# Patient Record
Sex: Female | Born: 1960
Health system: Southern US, Community
[De-identification: ages and names within clinical notes are randomized; demographics above are authoritative.]

## PROBLEM LIST (undated history)

## (undated) DIAGNOSIS — E7800 Pure hypercholesterolemia, unspecified: Secondary | ICD-10-CM

## (undated) DIAGNOSIS — Z8249 Family history of ischemic heart disease and other diseases of the circulatory system: Secondary | ICD-10-CM

## (undated) DIAGNOSIS — R7301 Impaired fasting glucose: Secondary | ICD-10-CM

## (undated) DIAGNOSIS — E785 Hyperlipidemia, unspecified: Secondary | ICD-10-CM

## (undated) DIAGNOSIS — R030 Elevated blood-pressure reading, without diagnosis of hypertension: Secondary | ICD-10-CM

## (undated) DIAGNOSIS — F32A Depression, unspecified: Secondary | ICD-10-CM

## (undated) DIAGNOSIS — M810 Age-related osteoporosis without current pathological fracture: Secondary | ICD-10-CM

## (undated) DIAGNOSIS — H269 Unspecified cataract: Secondary | ICD-10-CM

## (undated) HISTORY — PX: CATARACT EXTRACTION: SUR2

## (undated) HISTORY — DX: Unspecified cataract: H26.9

## (undated) HISTORY — DX: Elevated blood-pressure reading, without diagnosis of hypertension: R03.0

## (undated) HISTORY — DX: Age-related osteoporosis without current pathological fracture: M81.0

## (undated) HISTORY — DX: Depression, unspecified: F32.A

## (undated) HISTORY — DX: Impaired fasting glucose: R73.01

## (undated) HISTORY — DX: Pure hypercholesterolemia, unspecified: E78.00

## (undated) HISTORY — DX: Hyperlipidemia, unspecified: E78.5

## (undated) HISTORY — DX: Family history of ischemic heart disease and other diseases of the circulatory system: Z82.49

---

## 1992-07-27 HISTORY — PX: TUBAL LIGATION: SHX77

## 1997-12-21 ENCOUNTER — Other Ambulatory Visit: Admission: RE | Admit: 1997-12-21 | Discharge: 1997-12-21 | Payer: Self-pay | Admitting: Obstetrics & Gynecology

## 1999-02-14 ENCOUNTER — Other Ambulatory Visit: Admission: RE | Admit: 1999-02-14 | Discharge: 1999-02-14 | Payer: Self-pay | Admitting: Obstetrics & Gynecology

## 2000-10-07 ENCOUNTER — Other Ambulatory Visit: Admission: RE | Admit: 2000-10-07 | Discharge: 2000-10-07 | Payer: Self-pay | Admitting: Obstetrics & Gynecology

## 2004-07-23 ENCOUNTER — Other Ambulatory Visit: Admission: RE | Admit: 2004-07-23 | Discharge: 2004-07-23 | Payer: Self-pay | Admitting: Obstetrics & Gynecology

## 2010-07-27 HISTORY — PX: CATARACT EXTRACTION: SUR2

## 2014-05-04 ENCOUNTER — Ambulatory Visit (INDEPENDENT_AMBULATORY_CARE_PROVIDER_SITE_OTHER): Payer: BC Managed Care – PPO | Admitting: Gynecology

## 2014-05-04 ENCOUNTER — Encounter: Payer: Self-pay | Admitting: Gynecology

## 2014-05-04 VITALS — BP 104/60 | HR 70 | Resp 18 | Ht 61.0 in | Wt 120.0 lb

## 2014-05-04 DIAGNOSIS — Z01419 Encounter for gynecological examination (general) (routine) without abnormal findings: Secondary | ICD-10-CM

## 2014-05-04 DIAGNOSIS — Z124 Encounter for screening for malignant neoplasm of cervix: Secondary | ICD-10-CM

## 2014-05-04 NOTE — Progress Notes (Signed)
53 y.o. Married Caucasian female   G2P2002 here for annual exam. Pt reports menses are regular.  She does not report hot flashes, does not have night sweats, does not have vaginal dryness.  She is not using lubricants.  She still having cycles, mostly regular, 3d of flow.    Patient's last menstrual period was 04/10/2014.          Sexually active: Yes.    The current method of family planning is tubal ligation.    Exercising: No.  The patient does not participate in regular exercise at present. Last pap: 2-3 years ago - WNL  Abnormal PAP: yes, 9 years ago no colpo bx done Mammogram: 03/2014 Normal  BSE: yes  Colonoscopy: 07/2010- Normal f/u in 5 years DEXA: 10 years ago  Alcohol: 1-2 drinks/wk Tobacco: no   Labs: Marton Redwood, MD  There are no preventive care reminders to display for this patient.  Family History  Problem Relation Age of Onset  . Cancer Father   . Esophageal cancer Brother   . Heart attack Father     45's  . Thyroid disease Mother     There are no active problems to display for this patient.   History reviewed. No pertinent past medical history.  Past Surgical History  Procedure Laterality Date  . Tubal ligation  1994  . Cataract extraction Left   . Cesarean section  x1    Allergies: Review of patient's allergies indicates no known allergies.  Current Outpatient Prescriptions  Medication Sig Dispense Refill  . Calcium 1200-1000 MG-UNIT CHEW Chew by mouth.      . Cholecalciferol (VITAMIN D) 2000 UNITS CAPS Take by mouth.       No current facility-administered medications for this visit.    ROS: Pertinent items are noted in HPI.  Exam:    BP 104/60  Pulse 70  Resp 18  Ht 5\' 1"  (1.549 m)  Wt 120 lb (54.432 kg)  BMI 22.69 kg/m2  LMP 04/10/2014 Weight change: @WEIGHTCHANGE @ Last 3 height recordings:  Ht Readings from Last 3 Encounters:  05/04/14 5\' 1"  (1.549 m)   General appearance: alert, cooperative and appears stated age Head:  Normocephalic, without obvious abnormality, atraumatic Neck: no adenopathy, no carotid bruit, no JVD, supple, symmetrical, trachea midline and thyroid not enlarged, symmetric, no tenderness/mass/nodules Lungs: clear to auscultation bilaterally Breasts: normal appearance, no masses or tenderness Heart: regular rate and rhythm, S1, S2 normal, no murmur, click, rub or gallop Abdomen: soft, non-tender; bowel sounds normal; no masses,  no organomegaly Extremities: extremities normal, atraumatic, no cyanosis or edema Skin: Skin color, texture, turgor normal. No rashes or lesions Lymph nodes: Cervical, supraclavicular, and axillary nodes normal. no inguinal nodes palpated Neurologic: Grossly normal   Pelvic: External genitalia:  no lesions              Urethra: normal appearing urethra with no masses, tenderness or lesions              Bartholins and Skenes: Bartholin's, Urethra, Skene's normal                 Vagina: normal appearing vagina with normal color and discharge, no lesions              Cervix: normal appearance              Pap taken: Yes.          Bimanual Exam:  Uterus:  uterus is normal size, shape, consistency and  nontender                                      Adnexa:    normal adnexa in size, nontender and no masses                                      Rectovaginal: Confirms                                      Anus:  normal sphincter tone, no lesions  1. Encounter for routine gynecological examination  mammogram pap smear counseled on breast self exam, mammography screening, menopause, adequate intake of calcium and vitamin D, diet and exercise return annually or prn Discussed PAP guideline changes, importance of weight bearing exercises, calcium, vit D and balanced diet.  2. Screening for cervical cancer  - Pap Test with HP (IPS)     An After Visit Summary was printed and given to the patient.

## 2014-05-08 LAB — IPS PAP TEST WITH HPV

## 2014-05-28 ENCOUNTER — Encounter: Payer: Self-pay | Admitting: Gynecology

## 2015-10-29 ENCOUNTER — Encounter: Payer: Self-pay | Admitting: Internal Medicine

## 2015-11-12 ENCOUNTER — Encounter: Payer: Self-pay | Admitting: Internal Medicine

## 2016-03-22 DIAGNOSIS — Z23 Encounter for immunization: Secondary | ICD-10-CM | POA: Diagnosis not present

## 2016-04-10 DIAGNOSIS — L0889 Other specified local infections of the skin and subcutaneous tissue: Secondary | ICD-10-CM | POA: Diagnosis not present

## 2016-04-10 DIAGNOSIS — Z6823 Body mass index (BMI) 23.0-23.9, adult: Secondary | ICD-10-CM | POA: Diagnosis not present

## 2016-04-16 DIAGNOSIS — R21 Rash and other nonspecific skin eruption: Secondary | ICD-10-CM | POA: Diagnosis not present

## 2016-07-24 DIAGNOSIS — R7301 Impaired fasting glucose: Secondary | ICD-10-CM | POA: Diagnosis not present

## 2016-07-24 DIAGNOSIS — Z0000 Encounter for general adult medical examination without abnormal findings: Secondary | ICD-10-CM | POA: Diagnosis not present

## 2016-07-31 DIAGNOSIS — R7301 Impaired fasting glucose: Secondary | ICD-10-CM | POA: Diagnosis not present

## 2016-07-31 DIAGNOSIS — Z1389 Encounter for screening for other disorder: Secondary | ICD-10-CM | POA: Diagnosis not present

## 2016-07-31 DIAGNOSIS — F3289 Other specified depressive episodes: Secondary | ICD-10-CM | POA: Diagnosis not present

## 2016-07-31 DIAGNOSIS — N951 Menopausal and female climacteric states: Secondary | ICD-10-CM | POA: Diagnosis not present

## 2016-07-31 DIAGNOSIS — E784 Other hyperlipidemia: Secondary | ICD-10-CM | POA: Diagnosis not present

## 2016-07-31 DIAGNOSIS — Z0000 Encounter for general adult medical examination without abnormal findings: Secondary | ICD-10-CM | POA: Diagnosis not present

## 2016-08-03 ENCOUNTER — Ambulatory Visit (INDEPENDENT_AMBULATORY_CARE_PROVIDER_SITE_OTHER): Payer: BLUE CROSS/BLUE SHIELD | Admitting: Obstetrics & Gynecology

## 2016-08-03 ENCOUNTER — Encounter: Payer: Self-pay | Admitting: Obstetrics & Gynecology

## 2016-08-03 VITALS — BP 116/74 | HR 90 | Resp 16 | Ht 60.75 in | Wt 125.0 lb

## 2016-08-03 DIAGNOSIS — E7800 Pure hypercholesterolemia, unspecified: Secondary | ICD-10-CM | POA: Insufficient documentation

## 2016-08-03 DIAGNOSIS — N912 Amenorrhea, unspecified: Secondary | ICD-10-CM | POA: Diagnosis not present

## 2016-08-03 DIAGNOSIS — Z124 Encounter for screening for malignant neoplasm of cervix: Secondary | ICD-10-CM | POA: Diagnosis not present

## 2016-08-03 DIAGNOSIS — Z01419 Encounter for gynecological examination (general) (routine) without abnormal findings: Secondary | ICD-10-CM

## 2016-08-03 NOTE — Progress Notes (Signed)
56 y.o. G53P2002 Married Caucasian F here for annual exam.  Former patient of Dr. Brion Aliment.    Pt did have a cycle in August.  She'd had some bleeding about six months prior.  Bleeding significant diminished the last year.  She is now having hot flashes.  These are not too bothersome.  Does not want to be on any medication.   PCP:  Dr. Brigitte Pulse.  Last appt was last week.  Blood work was all normal except for mildly elevated cholesterol.  Total was 235 but her HDLs were good.  Patient's last menstrual period was 03/03/2016.          Sexually active: Yes.    The current method of family planning is tubal ligation.    Exercising: No.  The patient does not participate in regular exercise at present. Smoker:  Former smoker  Health Maintenance: Pap:  05/04/14 negative, HR HPV negative  History of abnormal Pap:  yes MMG:  04/16/16 at [facility]  Colonoscopy:  2012.  Pt was told she was not "cleaned out".  Did it at [facility].  Has appt at [facility].    BMD:   2005  TDaP:  PCP Pneumonia vaccine(s):  never Zostavax:   2016 at [place] C testing: donated blood about two years ago Screening Labs: PCP, Hb today: PCP, Urine today: PCP   reports that she has quit smoking. She has never used smokeless tobacco. She reports that she drinks about 0.5 - 1.0 oz of alcohol per week . She reports that she does not use drugs.  History reviewed. No pertinent past medical history.  Past Surgical History:  Procedure Laterality Date  . CATARACT EXTRACTION Left   . CESAREAN SECTION  x1  . TUBAL LIGATION  1994    Current Outpatient Prescriptions  Medication Sig Dispense Refill  . Cholecalciferol (VITAMIN D) 2000 UNITS CAPS Take by mouth.     No current facility-administered medications for this visit.     Family History  Problem Relation Age of Onset  . Cancer Father   . Esophageal cancer Brother   . Heart attack Father     9's  . Thyroid disease Mother     ROS:  Pertinent items are noted in  HPI.  Otherwise, a comprehensive ROS was negative.  Exam:   BP 116/74 (BP Location: Right Arm, Patient Position: Sitting, Cuff Size: Normal)   Pulse 90   Resp 16   Ht 5' 0.75" (1.543 m)   Wt 125 lb (56.7 kg)   LMP 03/03/2016   BMI 23.81 kg/m    Height: 5' 0.75" (154.3 cm)  Ht Readings from Last 3 Encounters:  08/03/16 5' 0.75" (1.543 m)  05/04/14 5\' 1"  (1.549 m)   General appearance: alert, cooperative and appears stated age Head: Normocephalic, without obvious abnormality, atraumatic Neck: no adenopathy, supple, symmetrical, trachea midline and thyroid normal to inspection and palpation Lungs: clear to auscultation bilaterally Breasts: normal appearance, no masses or tenderness Heart: regular rate and rhythm Abdomen: soft, non-tender; bowel sounds normal; no masses,  no organomegaly Extremities: extremities normal, atraumatic, no cyanosis or edema Skin: Skin color, texture, turgor normal. No rashes or lesions Lymph nodes: Cervical, supraclavicular, and axillary nodes normal. No abnormal inguinal nodes palpated Neurologic: Grossly normal   Pelvic: External genitalia:  no lesions              Urethra:  normal appearing urethra with no masses, tenderness or lesions  Bartholins and Skenes: normal                 Vagina: normal appearing vagina with normal color and discharge, no lesions              Cervix: no lesions              Pap taken: Yes.   Bimanual Exam:  Uterus:  normal size, contour, position, consistency, mobility, non-tender              Adnexa: normal adnexa and no mass, fullness, tenderness               Rectovaginal: Confirms               Anus:  normal sphincter tone, no lesions  Chaperone was present for exam.  A:  Well Woman with normal exam PMP, likely H/O mildly elevated LDLs  P:   Mammogram guidelines reviewed.  Release will be signed so I can get copy of MMG. Colonoscopy referral has been done by Dr. Brigitte Pulse pap smear obtained today [facility]  pending return annually or prn

## 2016-08-04 LAB — FOLLICLE STIMULATING HORMONE: FSH: 100.1 m[IU]/mL

## 2016-08-04 LAB — IPS PAP TEST WITH REFLEX TO HPV

## 2016-08-06 DIAGNOSIS — Z1212 Encounter for screening for malignant neoplasm of rectum: Secondary | ICD-10-CM | POA: Diagnosis not present

## 2016-09-10 ENCOUNTER — Telehealth: Payer: Self-pay

## 2016-09-10 NOTE — Telephone Encounter (Signed)
Rec'd from [facility] forward 23 pages to GI Historical Provider

## 2016-09-11 ENCOUNTER — Telehealth: Payer: Self-pay | Admitting: Gastroenterology

## 2016-09-11 NOTE — Telephone Encounter (Signed)
Dr.Jacobs reviewed records and accepted patient to schedule Direct Colonoscopy. Patient has been notified of this.

## 2016-09-25 ENCOUNTER — Encounter: Payer: Self-pay | Admitting: Gastroenterology

## 2016-11-13 ENCOUNTER — Ambulatory Visit (AMBULATORY_SURGERY_CENTER): Payer: BLUE CROSS/BLUE SHIELD | Admitting: *Deleted

## 2016-11-13 ENCOUNTER — Encounter: Payer: Self-pay | Admitting: Gastroenterology

## 2016-11-13 VITALS — Ht 61.0 in | Wt 123.3 lb

## 2016-11-13 DIAGNOSIS — Z1211 Encounter for screening for malignant neoplasm of colon: Secondary | ICD-10-CM

## 2016-11-13 MED ORDER — NA SULFATE-K SULFATE-MG SULF 17.5-3.13-1.6 GM/177ML PO SOLN: ORAL | 0 refills | Status: DC

## 2016-11-13 NOTE — Progress Notes (Signed)
Pt denies allergies to eggs or soy products. Denies difficulty with sedation or anesthesia. Denies any diet or weight loss medications. Denies use of supplemental oxygen.  Emmi instructions given for procedure.  

## 2016-11-27 ENCOUNTER — Ambulatory Visit (AMBULATORY_SURGERY_CENTER): Payer: BLUE CROSS/BLUE SHIELD | Admitting: Gastroenterology

## 2016-11-27 ENCOUNTER — Encounter: Payer: Self-pay | Admitting: Gastroenterology

## 2016-11-27 VITALS — BP 130/81 | HR 76 | Temp 97.3°F | Resp 13 | Ht 61.0 in | Wt 123.0 lb

## 2016-11-27 DIAGNOSIS — D125 Benign neoplasm of sigmoid colon: Secondary | ICD-10-CM | POA: Diagnosis not present

## 2016-11-27 DIAGNOSIS — Z1211 Encounter for screening for malignant neoplasm of colon: Secondary | ICD-10-CM | POA: Diagnosis not present

## 2016-11-27 DIAGNOSIS — K5730 Diverticulosis of large intestine without perforation or abscess without bleeding: Secondary | ICD-10-CM | POA: Diagnosis not present

## 2016-11-27 DIAGNOSIS — Z1212 Encounter for screening for malignant neoplasm of rectum: Secondary | ICD-10-CM

## 2016-11-27 MED ORDER — SODIUM CHLORIDE 0.9 % IV SOLN: 500.0000 mL | INTRAVENOUS | Status: DC

## 2016-11-27 NOTE — Progress Notes (Signed)
Called to room to assist during endoscopic procedure.  Patient ID and intended procedure confirmed with present staff. Received instructions for my participation in the procedure from the performing physician.  

## 2016-11-27 NOTE — Op Note (Signed)
[place] Patient Name: Susan Vega Procedure Date: 11/27/2016 9:48 AM MRN: [id number] Endoscopist: Milus Banister , MD Age: 56 Referring MD:  Date of Birth: [birth year] Gender: Female Account #: [id number] Procedure:                Colonoscopy Indications:              Screening for colorectal malignant neoplasm;                            colonoscopy Dr. Virgel Bouquet 2012 with "inadequate                            prep" and he recommended repeat colonoscopy at 5                            year interval Medicines:                Monitored Anesthesia Care Procedure:                Pre-Anesthesia Assessment:                           - Prior to the procedure, a History and Physical                            was performed, and patient medications and                            allergies were reviewed. The patient's tolerance of                            previous anesthesia was also reviewed. The risks                            and benefits of the procedure and the sedation                            options and risks were discussed with the patient.                            All questions were answered, and informed consent                            was obtained. Prior Anticoagulants: The patient has                            taken no previous anticoagulant or antiplatelet                            agents. ASA Grade Assessment: II - A patient with                            mild systemic disease. After reviewing the risks  and benefits, the patient was deemed in                            satisfactory condition to undergo the procedure.                           After obtaining informed consent, the colonoscope                            was passed under direct vision. Throughout the                            procedure, the patient's blood pressure, pulse, and                            oxygen saturations were monitored continuously. The                             Model PCF-H190DL [id number]) scope was introduced                            through the anus and advanced to the the cecum,                            identified by appendiceal orifice and ileocecal                            valve. The colonoscopy was performed without                            difficulty. The patient tolerated the procedure                            well. The quality of the bowel preparation was                            excellent. The ileocecal valve, appendiceal                            orifice, and rectum were photographed. Scope In: 10:04:06 AM Scope Out: 10:15:56 AM Scope Withdrawal Time: 0 hours 9 minutes 7 seconds  Total Procedure Duration: 0 hours 11 minutes 50 seconds  Findings:                 A 3 mm polyp was found in the sigmoid colon. The                            polyp was sessile. The polyp was removed with a                            cold snare. Resection and retrieval were complete.                           Multiple small-mouthed diverticula were found in  the left colon.                           The exam was otherwise without abnormality on                            direct and retroflexion views. Complications:            No immediate complications. Estimated blood loss:                            None. Estimated Blood Loss:     Estimated blood loss: none. Impression:               - One 3 mm polyp in the sigmoid colon, removed with                            a cold snare. Resected and retrieved.                           - Diverticulosis in the left colon.                           - The examination was otherwise normal on direct                            and retroflexion views. Recommendation:           - Patient has a contact number available for                            emergencies. The signs and symptoms of potential                            delayed complications were  discussed with the                            patient. Return to normal activities tomorrow.                            Written discharge instructions were provided to the                            patient.                           - Resume previous diet.                           - Continue present medications.                           You will receive a letter within 2-3 weeks with the                            pathology results and my final recommendations.  If the polyp(s) is proven to be 'pre-cancerous' on                            pathology, you will need repeat colonoscopy in 5                            years. If the polyp(s) is NOT 'precancerous' on                            pathology then you should repeat colon cancer                            screening in 10 years with colonoscopy without need                            for colon cancer screening by any method prior to                            then (including stool testing). Milus Banister, MD 11/27/2016 10:20:49 AM This report has been signed electronically.

## 2016-11-27 NOTE — Patient Instructions (Signed)
Handouts given on polyps and diverticulosis  YOU HAD AN ENDOSCOPIC PROCEDURE TODAY: Refer to the procedure report and other information in the discharge instructions given to you for any specific questions about what was found during the examination. If this information does not answer your questions, please call [place] office at [phone] to clarify.   YOU SHOULD EXPECT: Some feelings of bloating in the abdomen. Passage of more gas than usual. Walking can help get rid of the air that was put into your GI tract during the procedure and reduce the bloating. If you had a lower endoscopy (such as a colonoscopy or flexible sigmoidoscopy) you may notice spotting of blood in your stool or on the toilet paper. Some abdominal soreness may be present for a day or two, also.  DIET: Your first meal following the procedure should be a light meal and then it is ok to progress to your normal diet. A half-sandwich or bowl of soup is an example of a good first meal. Heavy or fried foods are harder to digest and may make you feel nauseous or bloated. Drink plenty of fluids but you should avoid alcoholic beverages for 24 hours. If you had a esophageal dilation, please see attached instructions for diet.    ACTIVITY: Your care partner should take you home directly after the procedure. You should plan to take it easy, moving slowly for the rest of the day. You can resume normal activity the day after the procedure however YOU SHOULD NOT DRIVE, use power tools, machinery or perform tasks that involve climbing or major physical exertion for 24 hours (because of the sedation medicines used during the test).   SYMPTOMS TO REPORT IMMEDIATELY: A gastroenterologist can be reached at any hour. Please call [phone]  for any of the following symptoms:  Following lower endoscopy (colonoscopy, flexible sigmoidoscopy) Excessive amounts of blood in the stool  Significant tenderness, worsening of abdominal pains  Swelling of  the abdomen that is new, acute  Fever of 100 or higher    FOLLOW UP:  If any biopsies were taken you will be contacted by phone or by letter within the next 1-3 weeks. Call [phone]  if you have not heard about the biopsies in 3 weeks.  Please also call with any specific questions about appointments or follow up tests.  

## 2016-11-27 NOTE — Progress Notes (Signed)
Pt's states no medical or surgical changes since previsit or office visit. 

## 2016-11-27 NOTE — Progress Notes (Signed)
Report to PACU, RN, vss, BBS= Clear.  

## 2016-11-30 ENCOUNTER — Telehealth: Payer: Self-pay

## 2016-11-30 NOTE — Telephone Encounter (Signed)
  Follow up Call-  Call back number 11/27/2016  Post procedure Call Back phone  # [phone]  Permission to leave phone message Yes  Some recent data might be hidden     Patient questions:  Do you have a fever, pain , or abdominal swelling? No. Pain Score  0 *  Have you tolerated food without any problems? Yes.    Have you been able to return to your normal activities? Yes.    Do you have any questions about your discharge instructions: Diet   No. Medications  No. Follow up visit  No.  Do you have questions or concerns about your Care? No.  Actions: * If pain score is 4 or above: No action needed, pain <4.

## 2016-12-01 ENCOUNTER — Encounter: Payer: Self-pay | Admitting: Gastroenterology

## 2017-04-23 DIAGNOSIS — Z1231 Encounter for screening mammogram for malignant neoplasm of breast: Secondary | ICD-10-CM | POA: Diagnosis not present

## 2017-04-27 DIAGNOSIS — R922 Inconclusive mammogram: Secondary | ICD-10-CM | POA: Diagnosis not present

## 2017-06-10 DIAGNOSIS — M546 Pain in thoracic spine: Secondary | ICD-10-CM | POA: Diagnosis not present

## 2017-06-10 DIAGNOSIS — M25511 Pain in right shoulder: Secondary | ICD-10-CM | POA: Diagnosis not present

## 2017-06-10 DIAGNOSIS — Z6823 Body mass index (BMI) 23.0-23.9, adult: Secondary | ICD-10-CM | POA: Diagnosis not present

## 2017-07-30 DIAGNOSIS — E7849 Other hyperlipidemia: Secondary | ICD-10-CM | POA: Diagnosis not present

## 2017-07-30 DIAGNOSIS — R7301 Impaired fasting glucose: Secondary | ICD-10-CM | POA: Diagnosis not present

## 2017-08-06 DIAGNOSIS — R7301 Impaired fasting glucose: Secondary | ICD-10-CM | POA: Diagnosis not present

## 2017-08-06 DIAGNOSIS — E7849 Other hyperlipidemia: Secondary | ICD-10-CM | POA: Diagnosis not present

## 2017-08-06 DIAGNOSIS — Z1389 Encounter for screening for other disorder: Secondary | ICD-10-CM | POA: Diagnosis not present

## 2017-08-06 DIAGNOSIS — Z0000 Encounter for general adult medical examination without abnormal findings: Secondary | ICD-10-CM | POA: Diagnosis not present

## 2017-08-06 DIAGNOSIS — Z23 Encounter for immunization: Secondary | ICD-10-CM | POA: Diagnosis not present

## 2017-08-06 DIAGNOSIS — F3289 Other specified depressive episodes: Secondary | ICD-10-CM | POA: Diagnosis not present

## 2017-10-19 DIAGNOSIS — J029 Acute pharyngitis, unspecified: Secondary | ICD-10-CM | POA: Diagnosis not present

## 2017-10-19 DIAGNOSIS — R05 Cough: Secondary | ICD-10-CM | POA: Diagnosis not present

## 2017-10-29 ENCOUNTER — Ambulatory Visit: Payer: BLUE CROSS/BLUE SHIELD | Admitting: Obstetrics & Gynecology

## 2018-01-14 ENCOUNTER — Ambulatory Visit: Payer: BLUE CROSS/BLUE SHIELD | Admitting: Obstetrics & Gynecology

## 2018-01-14 ENCOUNTER — Other Ambulatory Visit (HOSPITAL_COMMUNITY)
Admission: RE | Admit: 2018-01-14 | Discharge: 2018-01-14 | Disposition: A | Discharge status: Home / Self Care | Payer: BLUE CROSS/BLUE SHIELD | Source: Ambulatory Visit | Attending: Obstetrics & Gynecology | Admitting: Obstetrics & Gynecology

## 2018-01-14 ENCOUNTER — Encounter: Payer: Self-pay | Admitting: Obstetrics & Gynecology

## 2018-01-14 VITALS — BP 126/78 | HR 88 | Resp 16 | Ht 60.75 in | Wt 121.6 lb

## 2018-01-14 DIAGNOSIS — Z124 Encounter for screening for malignant neoplasm of cervix: Secondary | ICD-10-CM | POA: Insufficient documentation

## 2018-01-14 DIAGNOSIS — Z01419 Encounter for gynecological examination (general) (routine) without abnormal findings: Secondary | ICD-10-CM

## 2018-01-14 NOTE — Progress Notes (Signed)
57 y.o. [id number] MarriedCaucasianF here for annual exam.  Doing well.  Denies vaginal bleeding.  Still having some hot flashes and this is very manageable for her.  Did have a colonoscopy done last year.  Adenoma was present.  Will need follow up in 5 years.    Husband retired due to hand disability.  He is now on medicare.  She is not sure what her insurance will be next year.    Has experienced some nipple itching over the past year.    PCP:  Dr. Brigitte Pulse.  Last appt was end of December or early January.    Patient's last menstrual period was 02/25/2016 (approximate).          Sexually active: Yes.    The current method of family planning is tubal ligation.    Exercising: No.  The patient does not participate in regular exercise at present. Smoker:  no  Health Maintenance: Pap:  08/03/16 Neg  05/04/14 Neg. HR HPV:neg  History of abnormal Pap:  Yes, years ago MMG:  04/16/16 [place] left BIRADS1:neg.  04/27/17 per pt. Normal Colonoscopy:  11/27/16 polyps. F/u 5 years  BMD:   >10 years ago TDaP:  PCP Pneumonia vaccine(s):  n/a Shingrix: Zostavax  2016 Hep C testing: No Screening Labs: PCP   reports that she has quit smoking. She has never used smokeless tobacco. She reports that she drinks about 0.6 - 1.2 oz of alcohol per week. She reports that she does not use drugs.  Past Medical History:  Diagnosis Date  . Cataract    surgery on l eye    Past Surgical History:  Procedure Laterality Date  . CATARACT EXTRACTION Left   . CESAREAN SECTION  x1  . TUBAL LIGATION  1994    Current Outpatient Medications  Medication Sig Dispense Refill  . Cholecalciferol (VITAMIN D) 2000 UNITS CAPS Take by mouth.    . cyclobenzaprine (FLEXERIL) 10 MG tablet Take 1 tablet by mouth daily as needed.  0  . IBU 800 MG tablet daily as needed.  0   No current facility-administered medications for this visit.     Family History  Problem Relation Age of Onset  . Heart attack Father        73's  . Heart  disease Father   . Cancer Father   . Esophageal cancer Brother   . Thyroid disease Mother   . Colon cancer Neg Hx     Review of Systems  All other systems reviewed and are negative.   Exam:   BP 126/78 (BP Location: Right Arm, Patient Position: Sitting, Cuff Size: Normal)   Pulse 88   Resp 16   Ht 5' 0.75" (1.543 m)   Wt 121 lb 9.6 oz (55.2 kg)   LMP 02/25/2016 (Approximate)   BMI 23.17 kg/m    Height: 5' 0.75" (154.3 cm)  Ht Readings from Last 3 Encounters:  01/14/18 5' 0.75" (1.543 m)  11/27/16 5\' 1"  (1.549 m)  11/13/16 5\' 1"  (1.549 m)    General appearance: alert, cooperative and appears stated age Head: Normocephalic, without obvious abnormality, atraumatic Neck: no adenopathy, supple, symmetrical, trachea midline and thyroid normal to inspection and palpation Lungs: clear to auscultation bilaterally Breasts: normal appearance, no masses or tenderness, erythematous left nipple, no mass or nipple discharge Heart: regular rate and rhythm Abdomen: soft, non-tender; bowel sounds normal; no masses,  no organomegaly Extremities: extremities normal, atraumatic, no cyanosis or edema Skin: Skin color, texture, turgor normal. No rashes  or lesions Lymph nodes: Cervical, supraclavicular, and axillary nodes normal. No abnormal inguinal nodes palpated Neurologic: Grossly normal   Pelvic: External genitalia:  no lesions              Urethra:  normal appearing urethra with no masses, tenderness or lesions              Bartholins and Skenes: normal                 Vagina: normal appearing vagina with normal color and discharge, no lesions              Cervix: no lesions              Pap taken: Yes.   Bimanual Exam:  Uterus:  normal size, contour, position, consistency, mobility, non-tender              Adnexa: normal adnexa and no mass, fullness, tenderness               Rectovaginal: Confirms               Anus:  normal sphincter tone, no lesions  Chaperone was present for  exam.  A:  Well Woman with normal exam PMP, no HRT H/O mildly elevated LDLs Nipple itching  P:   Mammogram guidelines reviewed.  Release signed for last MMG. pap smear and HR HPV obtained today Colonoscopy due again in five years BMD will be done between 60-65 Pt will let me know name of cream she has used on her nipple for RF return annually or prn

## 2018-01-18 LAB — CYTOLOGY - PAP
Diagnosis: NEGATIVE
HPV: NOT DETECTED

## 2018-05-05 DIAGNOSIS — Z1231 Encounter for screening mammogram for malignant neoplasm of breast: Secondary | ICD-10-CM | POA: Diagnosis not present

## 2018-05-05 DIAGNOSIS — Z23 Encounter for immunization: Secondary | ICD-10-CM | POA: Diagnosis not present

## 2018-08-05 DIAGNOSIS — R82998 Other abnormal findings in urine: Secondary | ICD-10-CM | POA: Diagnosis not present

## 2018-08-05 DIAGNOSIS — E7849 Other hyperlipidemia: Secondary | ICD-10-CM | POA: Diagnosis not present

## 2018-08-05 DIAGNOSIS — R7301 Impaired fasting glucose: Secondary | ICD-10-CM | POA: Diagnosis not present

## 2018-08-05 DIAGNOSIS — Z0000 Encounter for general adult medical examination without abnormal findings: Secondary | ICD-10-CM | POA: Diagnosis not present

## 2018-08-12 DIAGNOSIS — E7849 Other hyperlipidemia: Secondary | ICD-10-CM | POA: Diagnosis not present

## 2018-08-12 DIAGNOSIS — Z1331 Encounter for screening for depression: Secondary | ICD-10-CM | POA: Diagnosis not present

## 2018-08-12 DIAGNOSIS — Z0000 Encounter for general adult medical examination without abnormal findings: Secondary | ICD-10-CM | POA: Diagnosis not present

## 2018-08-12 DIAGNOSIS — F3289 Other specified depressive episodes: Secondary | ICD-10-CM | POA: Diagnosis not present

## 2018-08-12 DIAGNOSIS — R7301 Impaired fasting glucose: Secondary | ICD-10-CM | POA: Diagnosis not present

## 2018-08-12 DIAGNOSIS — N951 Menopausal and female climacteric states: Secondary | ICD-10-CM | POA: Diagnosis not present

## 2018-09-29 DIAGNOSIS — H40013 Open angle with borderline findings, low risk, bilateral: Secondary | ICD-10-CM | POA: Diagnosis not present

## 2018-09-29 DIAGNOSIS — Z961 Presence of intraocular lens: Secondary | ICD-10-CM | POA: Diagnosis not present

## 2018-09-29 DIAGNOSIS — H25011 Cortical age-related cataract, right eye: Secondary | ICD-10-CM | POA: Diagnosis not present

## 2018-09-29 DIAGNOSIS — H2511 Age-related nuclear cataract, right eye: Secondary | ICD-10-CM | POA: Diagnosis not present

## 2019-03-16 DIAGNOSIS — H40013 Open angle with borderline findings, low risk, bilateral: Secondary | ICD-10-CM | POA: Diagnosis not present

## 2019-03-16 DIAGNOSIS — H25041 Posterior subcapsular polar age-related cataract, right eye: Secondary | ICD-10-CM | POA: Diagnosis not present

## 2019-03-16 DIAGNOSIS — H2511 Age-related nuclear cataract, right eye: Secondary | ICD-10-CM | POA: Diagnosis not present

## 2019-03-16 DIAGNOSIS — H25011 Cortical age-related cataract, right eye: Secondary | ICD-10-CM | POA: Diagnosis not present

## 2019-04-06 DIAGNOSIS — Z23 Encounter for immunization: Secondary | ICD-10-CM | POA: Diagnosis not present

## 2019-04-11 DIAGNOSIS — H25041 Posterior subcapsular polar age-related cataract, right eye: Secondary | ICD-10-CM | POA: Diagnosis not present

## 2019-04-11 DIAGNOSIS — H25811 Combined forms of age-related cataract, right eye: Secondary | ICD-10-CM | POA: Diagnosis not present

## 2019-04-11 DIAGNOSIS — H52221 Regular astigmatism, right eye: Secondary | ICD-10-CM | POA: Diagnosis not present

## 2019-04-11 DIAGNOSIS — H25011 Cortical age-related cataract, right eye: Secondary | ICD-10-CM | POA: Diagnosis not present

## 2019-04-11 DIAGNOSIS — H2511 Age-related nuclear cataract, right eye: Secondary | ICD-10-CM | POA: Diagnosis not present

## 2019-05-03 ENCOUNTER — Other Ambulatory Visit: Payer: Self-pay

## 2019-05-04 ENCOUNTER — Ambulatory Visit (INDEPENDENT_AMBULATORY_CARE_PROVIDER_SITE_OTHER): Payer: BC Managed Care – PPO | Admitting: Obstetrics & Gynecology

## 2019-05-04 ENCOUNTER — Encounter: Payer: Self-pay | Admitting: Obstetrics & Gynecology

## 2019-05-04 VITALS — BP 110/70 | HR 96 | Temp 96.4°F | Ht 60.5 in | Wt 115.8 lb

## 2019-05-04 DIAGNOSIS — Z01419 Encounter for gynecological examination (general) (routine) without abnormal findings: Secondary | ICD-10-CM

## 2019-05-04 NOTE — Patient Instructions (Signed)
[facility] at [place],  [place]  [id number]: [phone]  Sunday:Closed Monday:7:30 AM - 6:00 PM Tuesday:7:30 AM - 6:00 PM Wednesday:7:30 AM - 6:00 PM Thursday:7:30 AM - 6:00 PM Friday:7:30 AM - 6:00 PM Saturday:Closed

## 2019-05-04 NOTE — Progress Notes (Signed)
58 y.o. G16P2002 Married White or Caucasian female here for annual exam.  Doing well.  Works in a [employer] as a Research scientist (physical sciences).  Had cataract surgery 04/11/2019.    Denies vaginal bleeding.   Husband retired three years ago.  PCP:  Dr. Brigitte Pulse.  Blood work was done 07/2018.  Patient's last menstrual period was 03/03/2016.          Sexually active: Yes.    The current method of family planning is post menopausal status.    Exercising: No.   Smoker:  no  Health Maintenance: Pap:  01/14/18 Neg. HR HPV:neg   08/03/16 Neg History of abnormal Pap:  Yes, remote hx MMG:  04/27/17 diagnostic left BIRADS2:benign/ f/u 1 year. Has appt scheduled 05/25/19 Colonoscopy:  11/27/16 polyps. F/u 5 years. BMD:   > 10 years ago TDaP:  Unsure (thinks it is up to date) Pneumonia vaccine(s):  n/a Shingrix:   No Flu vacc: done  Hep C testing: No Screening Labs: PCP   reports that she has quit smoking. She has never used smokeless tobacco. She reports current alcohol use of about 4.0 standard drinks of alcohol per week. She reports that she does not use drugs.  Past Medical History:  Diagnosis Date  . Cataract    surgery on l eye    Past Surgical History:  Procedure Laterality Date  . CATARACT EXTRACTION Left   . CESAREAN SECTION  x1  . TUBAL LIGATION  1994    Current Outpatient Medications  Medication Sig Dispense Refill  . Cholecalciferol (VITAMIN D) 2000 UNITS CAPS Take by mouth.    . cyclobenzaprine (FLEXERIL) 10 MG tablet Take 1 tablet by mouth daily as needed.  0  . IBU 800 MG tablet daily as needed.  0  . ketorolac (ACULAR) 0.5 % ophthalmic solution INSTILL 1 DROP INTO RIGHT EYE QID START 72 HOURS PRIOR TO SURGERY    . prednisoLONE acetate (PRED FORTE) 1 % ophthalmic suspension INSTILL 1 DROP INTO RIGHT EYE QID USE ONLY AFTER SURGERY    . triamcinolone cream (KENALOG) 0.1 % APPLY TO AFFECTED AREA TID AS NEEDED     No current facility-administered medications for this visit.     Family  History  Problem Relation Age of Onset  . Heart attack Father        73's  . Heart disease Father   . Cancer Father   . Esophageal cancer Brother   . Thyroid disease Mother   . Colon cancer Neg Hx     Review of Systems  All other systems reviewed and are negative.   Exam:   BP 110/70   Pulse 96   Temp (!) 96.4 F (35.8 C) (Temporal)   Ht 5' 0.5" (1.537 m)   Wt 115 lb 12.8 oz (52.5 kg)   LMP 03/03/2016   BMI 22.24 kg/m     Height: 5' 0.5" (153.7 cm)  Ht Readings from Last 3 Encounters:  05/04/19 5' 0.5" (1.537 m)  01/14/18 5' 0.75" (1.543 m)  11/27/16 5\' 1"  (1.549 m)    General appearance: alert, cooperative and appears stated age Head: Normocephalic, without obvious abnormality, atraumatic Neck: no adenopathy, supple, symmetrical, trachea midline and thyroid normal to inspection and palpation Lungs: clear to auscultation bilaterally Breasts: normal appearance, no masses or tenderness Heart: regular rate and rhythm Abdomen: soft, non-tender; bowel sounds normal; no masses,  no organomegaly Extremities: extremities normal, atraumatic, no cyanosis or edema Skin: Skin color, texture, turgor normal. No rashes or  lesions Lymph nodes: Cervical, supraclavicular, and axillary nodes normal. No abnormal inguinal nodes palpated Neurologic: Grossly normal   Pelvic: External genitalia:  no lesions              Urethra:  normal appearing urethra with no masses, tenderness or lesions              Bartholins and Skenes: normal                 Vagina: normal appearing vagina with normal color and discharge, no lesions              Cervix: no lesions              Pap taken: No. Bimanual Exam:  Uterus:  normal size, contour, position, consistency, mobility, non-tender              Adnexa: normal adnexa and no mass, fullness, tenderness               Rectovaginal: Confirms               Anus:  normal sphincter tone, no lesions  Chaperone was present for exam.  A:  Well Woman with  normal exam PMP, no HRT H/o mildly elevated LDLs  P:   Mammogram guidelines reviewed. pap smear with neg HR HPV 2019.  Not indicated today. Vaccines UTD except shingrix which she is planning to get Plan BMD next year Lab work UTD with Dr. Brigitte Pulse Return annually or prn

## 2019-05-25 ENCOUNTER — Encounter: Payer: Self-pay | Admitting: Obstetrics & Gynecology

## 2019-05-25 DIAGNOSIS — Z1231 Encounter for screening mammogram for malignant neoplasm of breast: Secondary | ICD-10-CM | POA: Diagnosis not present

## 2019-08-02 DIAGNOSIS — E7849 Other hyperlipidemia: Secondary | ICD-10-CM | POA: Diagnosis not present

## 2019-08-02 DIAGNOSIS — D649 Anemia, unspecified: Secondary | ICD-10-CM | POA: Diagnosis not present

## 2019-08-11 DIAGNOSIS — R7301 Impaired fasting glucose: Secondary | ICD-10-CM | POA: Diagnosis not present

## 2019-08-11 DIAGNOSIS — E7849 Other hyperlipidemia: Secondary | ICD-10-CM | POA: Diagnosis not present

## 2019-08-18 DIAGNOSIS — E785 Hyperlipidemia, unspecified: Secondary | ICD-10-CM | POA: Diagnosis not present

## 2019-08-18 DIAGNOSIS — R7301 Impaired fasting glucose: Secondary | ICD-10-CM | POA: Diagnosis not present

## 2019-08-18 DIAGNOSIS — Z0000 Encounter for general adult medical examination without abnormal findings: Secondary | ICD-10-CM | POA: Diagnosis not present

## 2019-08-18 DIAGNOSIS — D649 Anemia, unspecified: Secondary | ICD-10-CM | POA: Diagnosis not present

## 2019-08-18 DIAGNOSIS — Z1331 Encounter for screening for depression: Secondary | ICD-10-CM | POA: Diagnosis not present

## 2019-08-18 DIAGNOSIS — F329 Major depressive disorder, single episode, unspecified: Secondary | ICD-10-CM | POA: Diagnosis not present

## 2019-08-30 DIAGNOSIS — R030 Elevated blood-pressure reading, without diagnosis of hypertension: Secondary | ICD-10-CM | POA: Diagnosis not present

## 2019-08-30 DIAGNOSIS — F418 Other specified anxiety disorders: Secondary | ICD-10-CM | POA: Diagnosis not present

## 2019-10-13 DIAGNOSIS — D649 Anemia, unspecified: Secondary | ICD-10-CM | POA: Diagnosis not present

## 2019-11-09 DIAGNOSIS — Z961 Presence of intraocular lens: Secondary | ICD-10-CM | POA: Diagnosis not present

## 2019-11-09 DIAGNOSIS — H35013 Changes in retinal vascular appearance, bilateral: Secondary | ICD-10-CM | POA: Diagnosis not present

## 2019-11-09 DIAGNOSIS — H40013 Open angle with borderline findings, low risk, bilateral: Secondary | ICD-10-CM | POA: Diagnosis not present

## 2019-11-09 DIAGNOSIS — H35033 Hypertensive retinopathy, bilateral: Secondary | ICD-10-CM | POA: Diagnosis not present

## 2020-04-18 DIAGNOSIS — Z20828 Contact with and (suspected) exposure to other viral communicable diseases: Secondary | ICD-10-CM | POA: Diagnosis not present

## 2020-05-04 DIAGNOSIS — Z23 Encounter for immunization: Secondary | ICD-10-CM | POA: Diagnosis not present

## 2020-05-31 DIAGNOSIS — Z1231 Encounter for screening mammogram for malignant neoplasm of breast: Secondary | ICD-10-CM | POA: Diagnosis not present

## 2020-06-14 DIAGNOSIS — R928 Other abnormal and inconclusive findings on diagnostic imaging of breast: Secondary | ICD-10-CM | POA: Diagnosis not present

## 2020-07-01 DIAGNOSIS — N6489 Other specified disorders of breast: Secondary | ICD-10-CM | POA: Diagnosis not present

## 2020-07-01 DIAGNOSIS — R921 Mammographic calcification found on diagnostic imaging of breast: Secondary | ICD-10-CM | POA: Diagnosis not present

## 2020-08-08 ENCOUNTER — Ambulatory Visit: Payer: BC Managed Care – PPO

## 2020-08-16 DIAGNOSIS — E785 Hyperlipidemia, unspecified: Secondary | ICD-10-CM | POA: Diagnosis not present

## 2020-08-16 DIAGNOSIS — R7301 Impaired fasting glucose: Secondary | ICD-10-CM | POA: Diagnosis not present

## 2020-08-16 DIAGNOSIS — Z0000 Encounter for general adult medical examination without abnormal findings: Secondary | ICD-10-CM | POA: Diagnosis not present

## 2020-08-23 DIAGNOSIS — Z0000 Encounter for general adult medical examination without abnormal findings: Secondary | ICD-10-CM | POA: Diagnosis not present

## 2020-08-23 DIAGNOSIS — R030 Elevated blood-pressure reading, without diagnosis of hypertension: Secondary | ICD-10-CM | POA: Diagnosis not present

## 2020-08-23 DIAGNOSIS — R82998 Other abnormal findings in urine: Secondary | ICD-10-CM | POA: Diagnosis not present

## 2020-08-23 DIAGNOSIS — E785 Hyperlipidemia, unspecified: Secondary | ICD-10-CM | POA: Diagnosis not present

## 2020-08-28 DIAGNOSIS — Z1212 Encounter for screening for malignant neoplasm of rectum: Secondary | ICD-10-CM | POA: Diagnosis not present

## 2020-11-08 DIAGNOSIS — H35362 Drusen (degenerative) of macula, left eye: Secondary | ICD-10-CM | POA: Diagnosis not present

## 2020-11-08 DIAGNOSIS — H35013 Changes in retinal vascular appearance, bilateral: Secondary | ICD-10-CM | POA: Diagnosis not present

## 2020-11-08 DIAGNOSIS — H40013 Open angle with borderline findings, low risk, bilateral: Secondary | ICD-10-CM | POA: Diagnosis not present

## 2020-11-08 DIAGNOSIS — H524 Presbyopia: Secondary | ICD-10-CM | POA: Diagnosis not present

## 2020-11-08 DIAGNOSIS — H35033 Hypertensive retinopathy, bilateral: Secondary | ICD-10-CM | POA: Diagnosis not present

## 2021-02-02 DIAGNOSIS — Z20822 Contact with and (suspected) exposure to covid-19: Secondary | ICD-10-CM | POA: Diagnosis not present

## 2021-05-21 DIAGNOSIS — M25512 Pain in left shoulder: Secondary | ICD-10-CM | POA: Diagnosis not present

## 2021-05-28 DIAGNOSIS — M7542 Impingement syndrome of left shoulder: Secondary | ICD-10-CM | POA: Diagnosis not present

## 2021-05-28 DIAGNOSIS — M25512 Pain in left shoulder: Secondary | ICD-10-CM | POA: Diagnosis not present

## 2021-05-30 DIAGNOSIS — M25512 Pain in left shoulder: Secondary | ICD-10-CM | POA: Diagnosis not present

## 2021-05-30 DIAGNOSIS — M7542 Impingement syndrome of left shoulder: Secondary | ICD-10-CM | POA: Diagnosis not present

## 2021-06-02 DIAGNOSIS — M25512 Pain in left shoulder: Secondary | ICD-10-CM | POA: Diagnosis not present

## 2021-06-02 DIAGNOSIS — M7542 Impingement syndrome of left shoulder: Secondary | ICD-10-CM | POA: Diagnosis not present

## 2021-06-06 DIAGNOSIS — M25512 Pain in left shoulder: Secondary | ICD-10-CM | POA: Diagnosis not present

## 2021-06-06 DIAGNOSIS — M7542 Impingement syndrome of left shoulder: Secondary | ICD-10-CM | POA: Diagnosis not present

## 2021-06-09 DIAGNOSIS — M25512 Pain in left shoulder: Secondary | ICD-10-CM | POA: Diagnosis not present

## 2021-06-09 DIAGNOSIS — M7542 Impingement syndrome of left shoulder: Secondary | ICD-10-CM | POA: Diagnosis not present

## 2021-06-12 DIAGNOSIS — Z23 Encounter for immunization: Secondary | ICD-10-CM | POA: Diagnosis not present

## 2021-06-13 DIAGNOSIS — M7542 Impingement syndrome of left shoulder: Secondary | ICD-10-CM | POA: Diagnosis not present

## 2021-06-13 DIAGNOSIS — M25512 Pain in left shoulder: Secondary | ICD-10-CM | POA: Diagnosis not present

## 2021-06-16 DIAGNOSIS — M7542 Impingement syndrome of left shoulder: Secondary | ICD-10-CM | POA: Diagnosis not present

## 2021-06-16 DIAGNOSIS — M25512 Pain in left shoulder: Secondary | ICD-10-CM | POA: Diagnosis not present

## 2021-06-18 DIAGNOSIS — M25512 Pain in left shoulder: Secondary | ICD-10-CM | POA: Diagnosis not present

## 2021-06-18 DIAGNOSIS — M7542 Impingement syndrome of left shoulder: Secondary | ICD-10-CM | POA: Diagnosis not present

## 2021-06-30 DIAGNOSIS — R928 Other abnormal and inconclusive findings on diagnostic imaging of breast: Secondary | ICD-10-CM | POA: Diagnosis not present

## 2021-06-30 DIAGNOSIS — R922 Inconclusive mammogram: Secondary | ICD-10-CM | POA: Diagnosis not present

## 2021-06-30 DIAGNOSIS — R921 Mammographic calcification found on diagnostic imaging of breast: Secondary | ICD-10-CM | POA: Diagnosis not present

## 2021-07-29 DIAGNOSIS — Z20822 Contact with and (suspected) exposure to covid-19: Secondary | ICD-10-CM | POA: Diagnosis not present

## 2021-08-01 DIAGNOSIS — H524 Presbyopia: Secondary | ICD-10-CM | POA: Diagnosis not present

## 2021-08-01 DIAGNOSIS — H40013 Open angle with borderline findings, low risk, bilateral: Secondary | ICD-10-CM | POA: Diagnosis not present

## 2021-09-23 DIAGNOSIS — Z0000 Encounter for general adult medical examination without abnormal findings: Secondary | ICD-10-CM | POA: Diagnosis not present

## 2021-09-23 DIAGNOSIS — E785 Hyperlipidemia, unspecified: Secondary | ICD-10-CM | POA: Diagnosis not present

## 2021-09-23 DIAGNOSIS — R7301 Impaired fasting glucose: Secondary | ICD-10-CM | POA: Diagnosis not present

## 2021-09-26 ENCOUNTER — Other Ambulatory Visit: Payer: Self-pay | Admitting: Internal Medicine

## 2021-09-26 DIAGNOSIS — Z1339 Encounter for screening examination for other mental health and behavioral disorders: Secondary | ICD-10-CM | POA: Diagnosis not present

## 2021-09-26 DIAGNOSIS — Z0000 Encounter for general adult medical examination without abnormal findings: Secondary | ICD-10-CM | POA: Diagnosis not present

## 2021-09-26 DIAGNOSIS — E785 Hyperlipidemia, unspecified: Secondary | ICD-10-CM

## 2021-09-26 DIAGNOSIS — Z1331 Encounter for screening for depression: Secondary | ICD-10-CM | POA: Diagnosis not present

## 2021-09-26 DIAGNOSIS — R030 Elevated blood-pressure reading, without diagnosis of hypertension: Secondary | ICD-10-CM | POA: Diagnosis not present

## 2021-11-03 ENCOUNTER — Ambulatory Visit
Admission: RE | Admit: 2021-11-03 | Discharge: 2021-11-03 | Disposition: A | Discharge status: Home / Self Care | Payer: No Typology Code available for payment source | Source: Ambulatory Visit | Attending: Internal Medicine | Admitting: Internal Medicine

## 2021-11-03 DIAGNOSIS — E785 Hyperlipidemia, unspecified: Secondary | ICD-10-CM

## 2022-01-02 ENCOUNTER — Encounter: Payer: Self-pay | Admitting: Gastroenterology

## 2022-04-24 IMAGING — CT CT CARDIAC CORONARY ARTERY CALCIUM SCORE
3 series · 14 of 20 positions shown, 16 images · non-contrast
Comparison: None.

CLINICAL DATA: 60-year-old white female

EXAM:
CT CARDIAC CORONARY ARTERY CALCIUM SCORE
TECHNIQUE: Non-contrast imaging through the heart was performed using
prospective ECG gating. Image post processing was performed on an
independent workstation, allowing for quantitative analysis of the
heart and coronary arteries. Note that this exam targets the heart
and the chest was not imaged in its entirety.

[Series 2: calcium scoring 2.00 qr36 bestdiast 70% hrt calciu · axial · 0.35mm/px · z∈[+1550,+1646]mm · 4 of 80 slices shown]
[im 16/80  vessel]
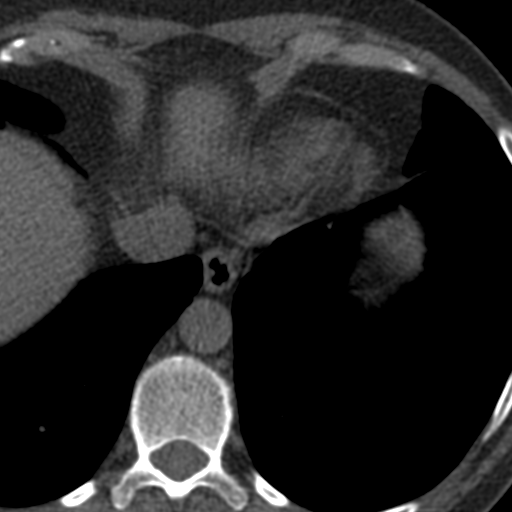
[im 32/80  vessel]
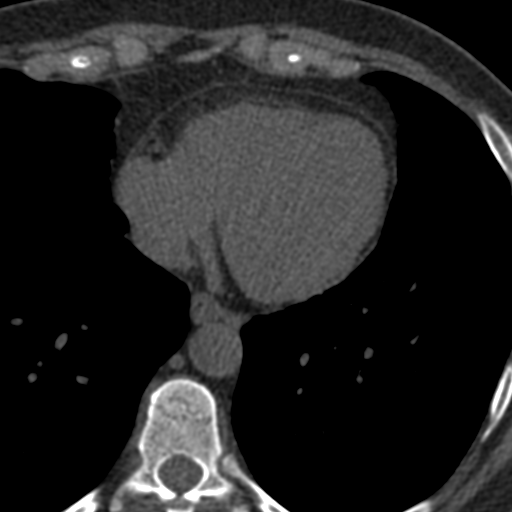
[im 48/80  vessel]
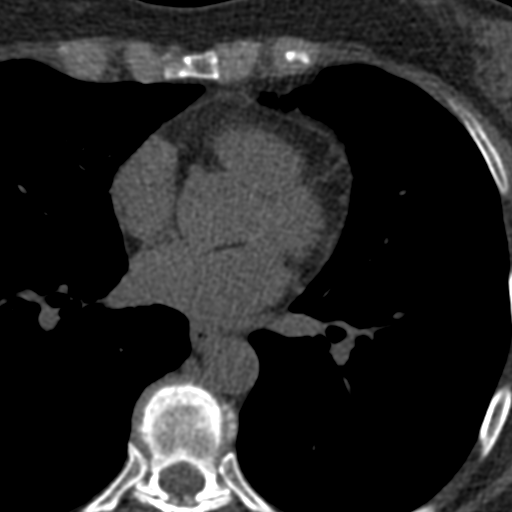
[im 64/80  vessel]
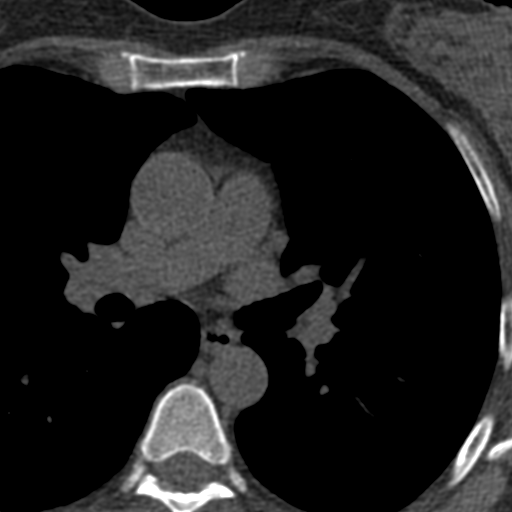

[Series 3: calcium scoring 2.00 br40 bestdiast 70% axial · axial · 0.52mm/px · z∈[+1548,+1652]mm · 5 of 79 slices shown, 7 images]
[im 14/79  vessel]
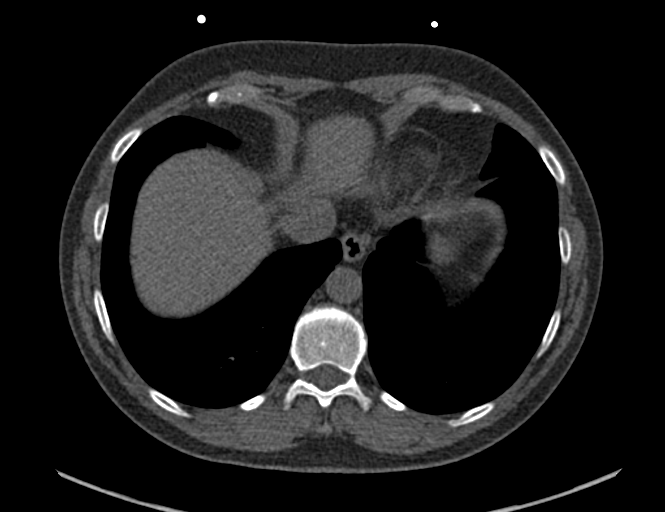
[im 14/79  lung]
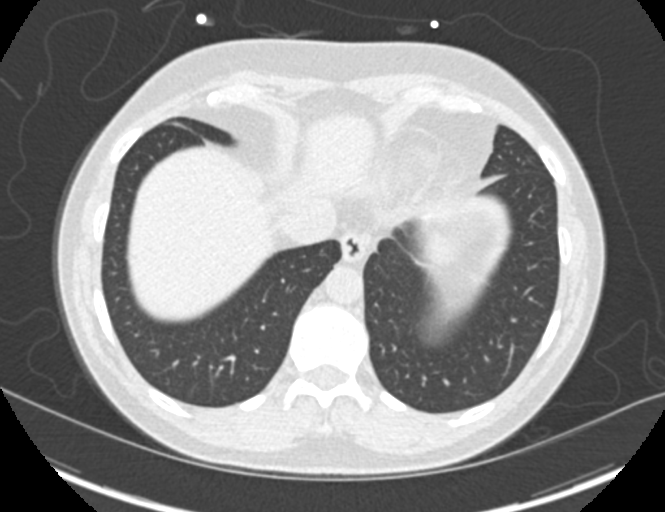
[im 27/79  vessel]
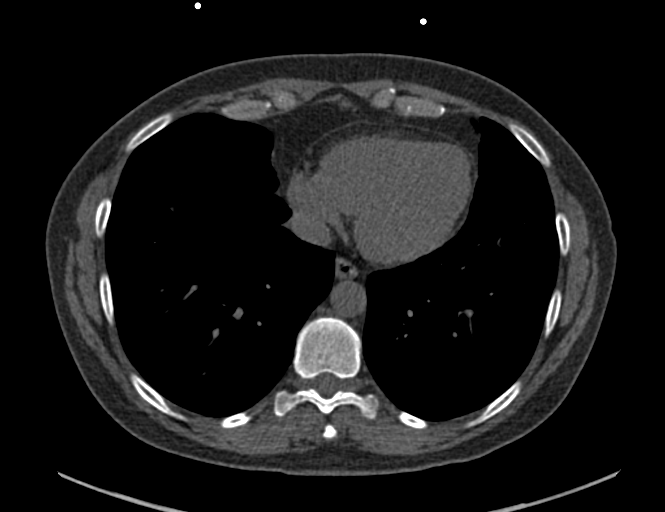
[im 40/79  vessel]
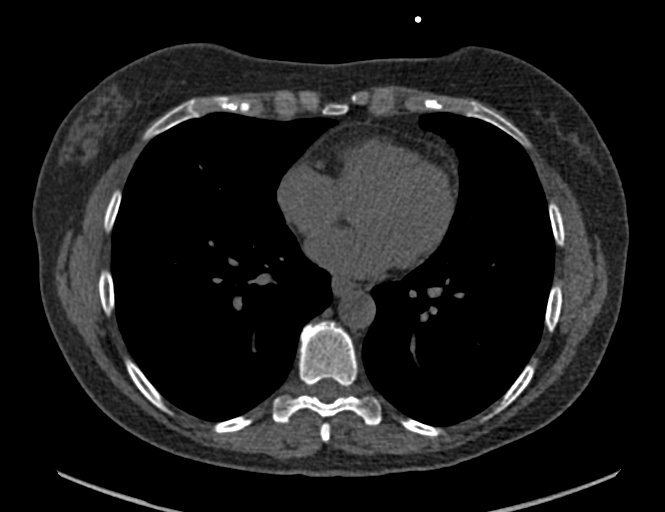
[im 53/79  vessel]
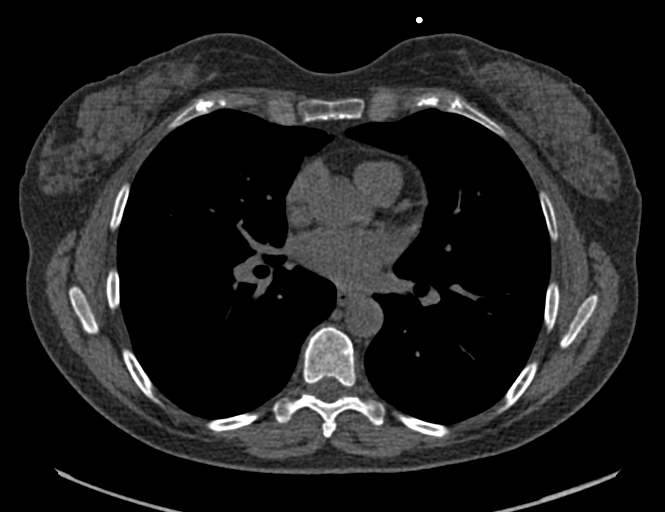
[im 66/79  vessel]
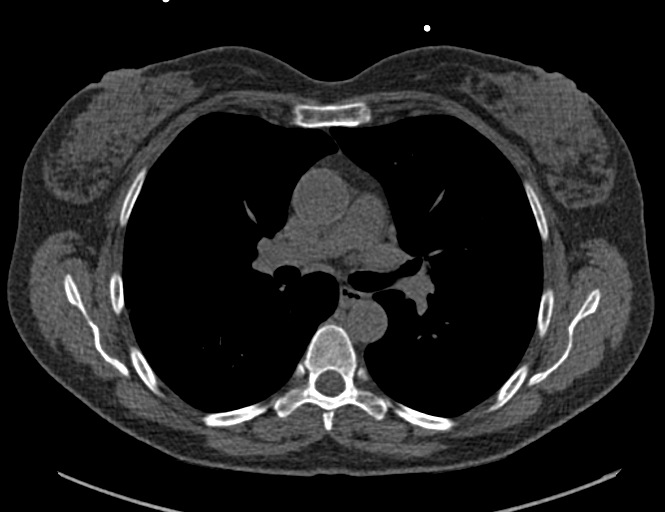
[im 66/79  lung]
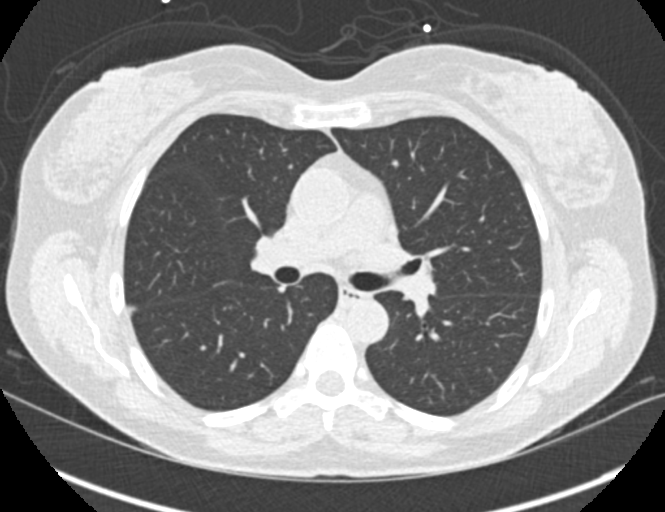

[Series 9: calcium scoring 2.00 br60 bestdiast 70% lungs · axial · 0.52mm/px · z∈[+1548,+1652]mm · 5 of 79 slices shown]
[im 14/79  vessel]
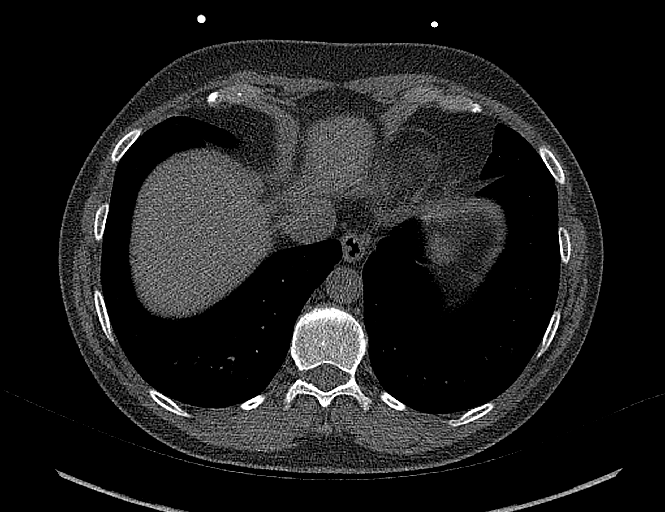
[im 27/79  vessel]
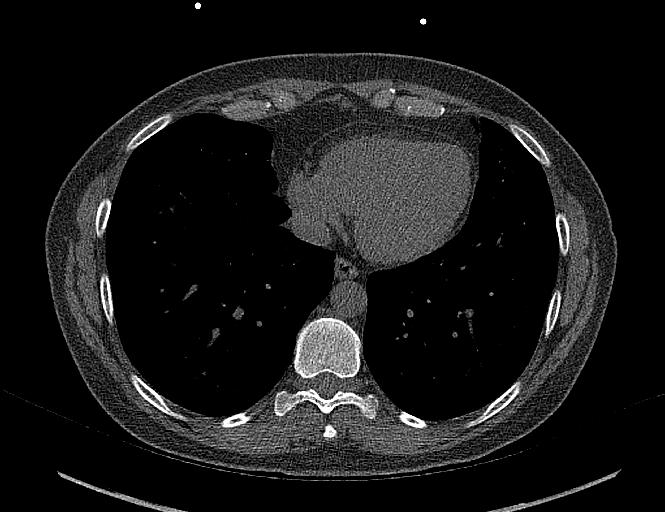
[im 40/79  vessel]
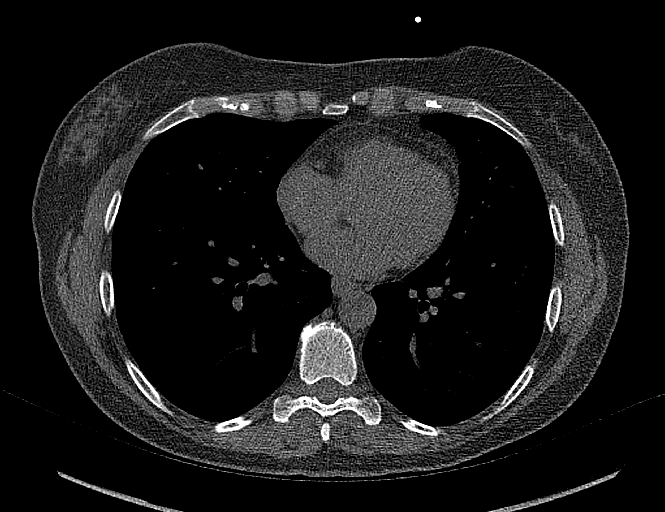
[im 53/79  vessel]
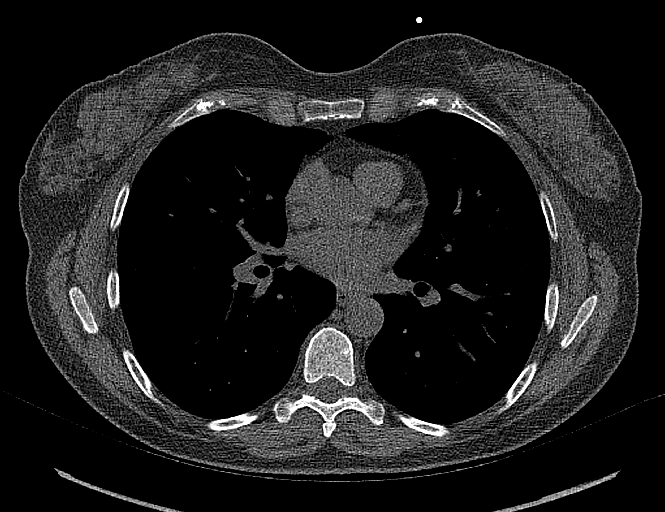
[im 66/79  vessel]
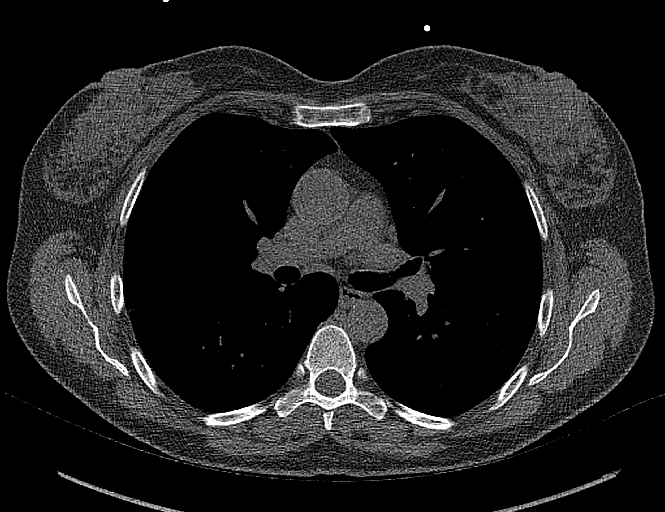

[14 of 20 positions shown; findings below may reference images not displayed]

FINDINGS: CORONARY CALCIUM SCORES:

Left Main: 0

LAD: 0

LCx: 0

RCA: 0

Total Agatston Score: 0

[HOSPITAL] percentile: 40%

AORTA MEASUREMENTS:

Ascending Aorta: 29 mm

Descending Aorta: 21 mm

OTHER FINDINGS:

Vascular: Normal heart size. No pericardial effusion. Normal caliber
thoracic aorta with mild calcified plaque.

Mediastinum/Nodes: Esophagus is unremarkable. No pathologically
enlarged lymph nodes seen in the chest.

Lungs/Pleura: Central airways are patent. No consolidation, pleural
effusion or pneumothorax.

Upper Abdomen: No acute abnormality.

Musculoskeletal: No chest wall mass or suspicious bone lesions
identified.
IMPRESSION: 1. Total Agatston Score: 0
2. [HOSPITAL] percentile: 40%

## 2022-05-02 DIAGNOSIS — Z23 Encounter for immunization: Secondary | ICD-10-CM | POA: Diagnosis not present

## 2022-05-26 DIAGNOSIS — R0981 Nasal congestion: Secondary | ICD-10-CM | POA: Diagnosis not present

## 2022-05-26 DIAGNOSIS — R5383 Other fatigue: Secondary | ICD-10-CM | POA: Diagnosis not present

## 2022-06-15 DIAGNOSIS — H524 Presbyopia: Secondary | ICD-10-CM | POA: Diagnosis not present

## 2022-06-15 DIAGNOSIS — H35363 Drusen (degenerative) of macula, bilateral: Secondary | ICD-10-CM | POA: Diagnosis not present

## 2022-06-15 DIAGNOSIS — H35013 Changes in retinal vascular appearance, bilateral: Secondary | ICD-10-CM | POA: Diagnosis not present

## 2022-06-15 DIAGNOSIS — H40013 Open angle with borderline findings, low risk, bilateral: Secondary | ICD-10-CM | POA: Diagnosis not present

## 2022-06-15 DIAGNOSIS — H35033 Hypertensive retinopathy, bilateral: Secondary | ICD-10-CM | POA: Diagnosis not present

## 2022-07-02 DIAGNOSIS — Z1231 Encounter for screening mammogram for malignant neoplasm of breast: Secondary | ICD-10-CM | POA: Diagnosis not present

## 2023-09-14 DIAGNOSIS — Z1231 Encounter for screening mammogram for malignant neoplasm of breast: Secondary | ICD-10-CM | POA: Diagnosis not present

## 2023-10-14 DIAGNOSIS — R030 Elevated blood-pressure reading, without diagnosis of hypertension: Secondary | ICD-10-CM | POA: Diagnosis not present

## 2023-10-14 DIAGNOSIS — E785 Hyperlipidemia, unspecified: Secondary | ICD-10-CM | POA: Diagnosis not present

## 2023-10-14 DIAGNOSIS — R7301 Impaired fasting glucose: Secondary | ICD-10-CM | POA: Diagnosis not present

## 2023-10-21 DIAGNOSIS — R82998 Other abnormal findings in urine: Secondary | ICD-10-CM | POA: Diagnosis not present

## 2023-10-21 DIAGNOSIS — Z79899 Other long term (current) drug therapy: Secondary | ICD-10-CM | POA: Diagnosis not present

## 2023-10-21 DIAGNOSIS — M810 Age-related osteoporosis without current pathological fracture: Secondary | ICD-10-CM | POA: Diagnosis not present

## 2023-11-11 ENCOUNTER — Encounter: Payer: Self-pay | Admitting: Pediatrics

## 2023-11-24 DIAGNOSIS — D225 Melanocytic nevi of trunk: Secondary | ICD-10-CM | POA: Diagnosis not present

## 2023-11-24 DIAGNOSIS — D2262 Melanocytic nevi of left upper limb, including shoulder: Secondary | ICD-10-CM | POA: Diagnosis not present

## 2023-11-24 DIAGNOSIS — L578 Other skin changes due to chronic exposure to nonionizing radiation: Secondary | ICD-10-CM | POA: Diagnosis not present

## 2023-11-24 DIAGNOSIS — L853 Xerosis cutis: Secondary | ICD-10-CM | POA: Diagnosis not present

## 2023-11-24 DIAGNOSIS — L2989 Other pruritus: Secondary | ICD-10-CM | POA: Diagnosis not present

## 2023-11-24 DIAGNOSIS — L814 Other melanin hyperpigmentation: Secondary | ICD-10-CM | POA: Diagnosis not present

## 2023-11-24 DIAGNOSIS — L821 Other seborrheic keratosis: Secondary | ICD-10-CM | POA: Diagnosis not present

## 2023-12-06 ENCOUNTER — Encounter: Payer: Self-pay | Admitting: Pediatrics

## 2023-12-06 ENCOUNTER — Ambulatory Visit (AMBULATORY_SURGERY_CENTER): Payer: Self-pay

## 2023-12-06 VITALS — Ht 60.5 in | Wt 135.0 lb

## 2023-12-06 DIAGNOSIS — Z860100 Personal history of colon polyps, unspecified: Secondary | ICD-10-CM

## 2023-12-06 MED ORDER — NA SULFATE-K SULFATE-MG SULF 17.5-3.13-1.6 GM/177ML PO SOLN: 1.0000 | Freq: Once | ORAL | 0 refills | Status: AC

## 2023-12-06 NOTE — Progress Notes (Signed)

## 2023-12-21 NOTE — Progress Notes (Unsigned)
 [facility] History and Physical   Primary Care Physician:  Jeannine Milroy., MD   Reason for Procedure:  History of adenomatous colon polyp  Plan:    Surveillance colonoscopy     HPI: Susan Vega is a 63 y.o. female undergoing surveillance colonoscopy for history of adenomatous colon polyp.  Patient underwent colonoscopy in 2018 which showed a 3 mm sigmoid colon tubular adenoma.  There is no family history of colon cancer or colon polyps.  Patient denies change in bowel habits or rectal bleeding at the time of this exam.   Past Medical History:  Diagnosis Date   Elevated LDL cholesterol level     Past Surgical History:  Procedure Laterality Date   CATARACT EXTRACTION Left 2012   right 03/2019   CESAREAN SECTION  x1   TUBAL LIGATION  1994    Prior to Admission medications   Medication Sig Start Date End Date Taking? Authorizing Provider  ALPRAZolam (XANAX) 0.5 MG tablet Take 0.25-0.5 mg by mouth 2 (two) times daily as needed. 10/21/23   [provider]  IBU 800 MG tablet daily as needed. 12/16/17   [provider]  valACYclovir (VALTREX) 500 MG tablet Take 500 mg by mouth as needed. 12/02/23   [provider]    Current Outpatient Medications  Medication Sig Dispense Refill   ALPRAZolam (XANAX) 0.5 MG tablet Take 0.25-0.5 mg by mouth 2 (two) times daily as needed.     IBU 800 MG tablet daily as needed.  0   valACYclovir (VALTREX) 500 MG tablet Take 500 mg by mouth as needed.     No current facility-administered medications for this visit.    Allergies as of 12/23/2023   (No Known Allergies)    Family History  Problem Relation Age of Onset   Thyroid  disease Mother    Heart attack Father        65's   Heart disease Father    Cancer Father    Esophageal cancer Brother    Colon cancer Neg Hx    Rectal cancer Neg Hx    Stomach cancer Neg Hx     Social History   Socioeconomic History   Marital status: Widowed     Spouse name: Not on file   Number of children: Not on file   Years of education: Not on file   Highest education level: Not on file  Occupational History   Not on file  Tobacco Use   Smoking status: Former   Smokeless tobacco: Never  Vaping Use   Vaping status: Never Used  Substance and Sexual Activity   Alcohol  use: Yes    Alcohol /week: 4.0 standard drinks of alcohol     Types: 4 Standard drinks or equivalent per week   Drug use: No   Sexual activity: Yes    Partners: Male    Birth control/protection: Surgical    Comment: Tubal Ligation  Other Topics Concern   Not on file  Social History Narrative   Not on file   Social Drivers of Health   Financial Resource Strain: Not on file  Food Insecurity: Not on file  Transportation Needs: Not on file  Physical Activity: Not on file  Stress: Not on file  Social Connections: Not on file  Intimate Partner Violence: Not on file    Review of Systems:  All other review of systems negative except as mentioned in the HPI.  Physical Exam: Vital signs LMP 02/25/2016 (Approximate)   General:  Alert,  Well-developed, well-nourished, pleasant and cooperative in NAD Airway:  Mallampati  Lungs:  Clear throughout to auscultation.   Heart:  Regular rate and rhythm; no murmurs, clicks, rubs,  or gallops. Abdomen:  Soft, nontender and nondistended. Normal bowel sounds.   Neuro/Psych:  Normal mood and affect. A and O x 3  Eugenia Hess, MD [facility]

## 2023-12-23 ENCOUNTER — Ambulatory Visit (AMBULATORY_SURGERY_CENTER): Admitting: Pediatrics

## 2023-12-23 ENCOUNTER — Encounter: Payer: Self-pay | Admitting: Pediatrics

## 2023-12-23 VITALS — BP 119/71 | HR 81 | Temp 98.0°F | Resp 17 | Ht 60.5 in | Wt 135.0 lb

## 2023-12-23 DIAGNOSIS — Z860101 Personal history of adenomatous and serrated colon polyps: Secondary | ICD-10-CM | POA: Diagnosis not present

## 2023-12-23 DIAGNOSIS — Z1211 Encounter for screening for malignant neoplasm of colon: Secondary | ICD-10-CM

## 2023-12-23 DIAGNOSIS — K5730 Diverticulosis of large intestine without perforation or abscess without bleeding: Secondary | ICD-10-CM | POA: Diagnosis not present

## 2023-12-23 DIAGNOSIS — K648 Other hemorrhoids: Secondary | ICD-10-CM | POA: Diagnosis not present

## 2023-12-23 DIAGNOSIS — Z860100 Personal history of colon polyps, unspecified: Secondary | ICD-10-CM

## 2023-12-23 MED ORDER — SODIUM CHLORIDE 0.9 % IV SOLN: 500.0000 mL | Freq: Once | INTRAVENOUS | Status: DC

## 2023-12-23 NOTE — Patient Instructions (Signed)
Resume previous diet and medications. Repeat Colonoscopy in 10 years for screening.  YOU HAD AN ENDOSCOPIC PROCEDURE TODAY AT [facility]:   Refer to the procedure report that was given to you for any specific questions about what was found during the examination.  If the procedure report does not answer your questions, please call your gastroenterologist to clarify.  If you requested that your care partner not be given the details of your procedure findings, then the procedure report has been included in a sealed envelope for you to review at your convenience later.  YOU SHOULD EXPECT: Some feelings of bloating in the abdomen. Passage of more gas than usual.  Walking can help get rid of the air that was put into your GI tract during the procedure and reduce the bloating. If you had a lower endoscopy (such as a colonoscopy or flexible sigmoidoscopy) you may notice spotting of blood in your stool or on the toilet paper. If you underwent a bowel prep for your procedure, you may not have a normal bowel movement for a few days.  Please Note:  You might notice some irritation and congestion in your nose or some drainage.  This is from the oxygen used during your procedure.  There is no need for concern and it should clear up in a day or so.  SYMPTOMS TO REPORT IMMEDIATELY:  Following lower endoscopy (colonoscopy or flexible sigmoidoscopy):  Excessive amounts of blood in the stool  Significant tenderness or worsening of abdominal pains  Swelling of the abdomen that is new, acute  Fever of 100F or higher  For urgent or emergent issues, a gastroenterologist can be reached at any hour by calling [phone]. Do not use MyChart messaging for urgent concerns.    DIET:  We do recommend a small meal at first, but then you may proceed to your regular diet.  Drink plenty of fluids but you should avoid alcoholic beverages for 24 hours.  ACTIVITY:  You should plan to take it easy for the  rest of today and you should NOT DRIVE or use heavy machinery until tomorrow (because of the sedation medicines used during the test).    FOLLOW UP: Our staff will call the number listed on your records the next business day following your procedure.  We will call around 7:15- 8:00 am to check on you and address any questions or concerns that you may have regarding the information given to you following your procedure. If we do not reach you, we will leave a message.     If any biopsies were taken you will be contacted by phone or by letter within the next 1-3 weeks.  Please call us at [phone] if you have not heard about the biopsies in 3 weeks.    SIGNATURES/CONFIDENTIALITY: You and/or your care partner have signed paperwork which will be entered into your electronic medical record.  These signatures attest to the fact that that the information above on your After Visit Summary has been reviewed and is understood.  Full responsibility of the confidentiality of this discharge information lies with you and/or your care-partner.

## 2023-12-23 NOTE — Op Note (Signed)
 [facility] Patient Name: Susan Vega Procedure Date: 12/23/2023 9:02 AM MRN: [id number] Endoscopist: Eugenia Hess , MD, [id number] Age: 63 Referring MD:  Date of Birth: Jan 17, 1961 Gender: Female Account #: [id number] Procedure:                Colonoscopy Indications:              High risk colon cancer surveillance: Personal                            history of non-advanced adenoma, Last colonoscopy:                            May 2018 Medicines:                Monitored Anesthesia Care Procedure:                Pre-Anesthesia Assessment:                           - Prior to the procedure, a History and Physical                            was performed, and patient medications and                            allergies were reviewed. The patient's tolerance of                            previous anesthesia was also reviewed. The risks                            and benefits of the procedure and the sedation                            options and risks were discussed with the patient.                            All questions were answered, and informed consent                            was obtained. Prior Anticoagulants: The patient has                            taken no anticoagulant or antiplatelet agents. ASA                            Grade Assessment: II - A patient with mild systemic                            disease. After reviewing the risks and benefits,                            the patient was deemed in satisfactory condition to  undergo the procedure.                           After obtaining informed consent, the colonoscope                            was passed under direct vision. Throughout the                            procedure, the patient's blood pressure, pulse, and                            oxygen saturations were monitored continuously. The                            PCF-H190TL Slim SN [id number] was introduced through                             the anus and advanced to the cecum, identified by                            appendiceal orifice and ileocecal valve. The                            colonoscopy was performed without difficulty. The                            patient tolerated the procedure well. The quality                            of the bowel preparation was good. The ileocecal                            valve, appendiceal orifice, and rectum were                            photographed. Scope In: 9:13:51 AM Scope Out: 9:27:32 AM Scope Withdrawal Time: 0 hours 9 minutes 41 seconds  Total Procedure Duration: 0 hours 13 minutes 41 seconds  Findings:                 Hemorrhoids were found on perianal exam.                           The digital rectal exam was normal. Pertinent                            negatives include normal sphincter tone and no                            palpable rectal lesions.                           Multiple medium-mouthed and small-mouthed  diverticula were found in the sigmoid colon,                            descending colon and ascending colon.                           Internal hemorrhoids were found during retroflexion. Complications:            No immediate complications. Estimated blood loss:                            None. Estimated Blood Loss:     Estimated blood loss: none. Impression:               - Hemorrhoids found on perianal exam.                           - Diverticulosis in the sigmoid colon, in the                            descending colon and in the ascending colon.                           - Internal hemorrhoids.                           - No specimens collected. Recommendation:           - Discharge patient to home (ambulatory).                           - Repeat colonoscopy in 10 years for surveillance.                           - The findings and recommendations were discussed                            with  the patient's family.                           - Return to referring physician.                           - Patient has a contact number available for                            emergencies. The signs and symptoms of potential                            delayed complications were discussed with the                            patient. Return to normal activities tomorrow.                            Written discharge instructions were provided to the  patient. Eugenia Hess, MD 12/23/2023 9:33:23 AM This report has been signed electronically.

## 2023-12-23 NOTE — Progress Notes (Signed)
 Pt's states no medical or surgical changes since previsit or office visit.

## 2023-12-23 NOTE — Progress Notes (Signed)
 Vss nad trans to pacu

## 2023-12-24 ENCOUNTER — Telehealth: Payer: Self-pay | Admitting: *Deleted

## 2023-12-24 NOTE — Telephone Encounter (Signed)
  Follow up Call-     12/23/2023    8:28 AM  Call back number  Post procedure Call Back phone  # [phone]  Permission to leave phone message Yes     Patient questions:  Do you have a fever, pain , or abdominal swelling? No. Pain Score  0 *  Have you tolerated food without any problems? Yes.    Have you been able to return to your normal activities? Yes.    Do you have any questions about your discharge instructions: Diet   No. Medications  No. Follow up visit  No.  Do you have questions or concerns about your Care? No.  Actions: * If pain score is 4 or above: No action needed, pain <4.

## 2024-02-01 DIAGNOSIS — R030 Elevated blood-pressure reading, without diagnosis of hypertension: Secondary | ICD-10-CM | POA: Diagnosis not present

## 2024-02-01 DIAGNOSIS — I498 Other specified cardiac arrhythmias: Secondary | ICD-10-CM | POA: Diagnosis not present

## 2024-02-01 DIAGNOSIS — R7301 Impaired fasting glucose: Secondary | ICD-10-CM | POA: Diagnosis not present

## 2024-02-01 DIAGNOSIS — E785 Hyperlipidemia, unspecified: Secondary | ICD-10-CM | POA: Diagnosis not present

## 2024-02-01 DIAGNOSIS — E663 Overweight: Secondary | ICD-10-CM | POA: Diagnosis not present

## 2024-02-01 DIAGNOSIS — F418 Other specified anxiety disorders: Secondary | ICD-10-CM | POA: Diagnosis not present

## 2024-02-01 DIAGNOSIS — F329 Major depressive disorder, single episode, unspecified: Secondary | ICD-10-CM | POA: Diagnosis not present

## 2024-02-25 ENCOUNTER — Other Ambulatory Visit: Payer: Self-pay | Admitting: *Deleted

## 2024-02-25 ENCOUNTER — Ambulatory Visit: Attending: Internal Medicine

## 2024-02-25 DIAGNOSIS — I498 Other specified cardiac arrhythmias: Secondary | ICD-10-CM

## 2024-02-25 DIAGNOSIS — R002 Palpitations: Secondary | ICD-10-CM

## 2024-02-25 NOTE — Progress Notes (Unsigned)
 Enrolled for [employer] to mail a ZIO XT long term holter monitor to the patients address on file.   EP to read

## 2024-03-16 DIAGNOSIS — R002 Palpitations: Secondary | ICD-10-CM | POA: Diagnosis not present

## 2024-03-18 DIAGNOSIS — R002 Palpitations: Secondary | ICD-10-CM

## 2024-03-18 DIAGNOSIS — I498 Other specified cardiac arrhythmias: Secondary | ICD-10-CM | POA: Diagnosis not present

## 2024-03-29 DIAGNOSIS — K5792 Diverticulitis of intestine, part unspecified, without perforation or abscess without bleeding: Secondary | ICD-10-CM | POA: Diagnosis not present

## 2024-03-29 DIAGNOSIS — Z23 Encounter for immunization: Secondary | ICD-10-CM | POA: Diagnosis not present

## 2024-03-29 DIAGNOSIS — R002 Palpitations: Secondary | ICD-10-CM | POA: Diagnosis not present

## 2024-04-10 DIAGNOSIS — I498 Other specified cardiac arrhythmias: Secondary | ICD-10-CM | POA: Diagnosis not present

## 2024-04-10 DIAGNOSIS — K219 Gastro-esophageal reflux disease without esophagitis: Secondary | ICD-10-CM | POA: Diagnosis not present

## 2024-04-10 DIAGNOSIS — B09 Unspecified viral infection characterized by skin and mucous membrane lesions: Secondary | ICD-10-CM | POA: Diagnosis not present

## 2024-04-10 DIAGNOSIS — E785 Hyperlipidemia, unspecified: Secondary | ICD-10-CM | POA: Diagnosis not present

## 2024-04-10 DIAGNOSIS — K5792 Diverticulitis of intestine, part unspecified, without perforation or abscess without bleeding: Secondary | ICD-10-CM | POA: Diagnosis not present

## 2024-04-12 NOTE — Progress Notes (Unsigned)
    Cardiology Office Note Date:  04/14/2024  ID:  Susan Vega, DOB [birth year], MRN [id number] PCP:  Loreli Elsie JONETTA Mickey., MD  Cardiologist: Joelle VEAR Ren Donley, MD  Chief Complaint  Patient presents with   Palpitations    History of Present Illness: Susan Vega is a 63 y.o. female who presents for palpitations.   She had been having palpitations monthly since her husband died about 4 years ago but a few months ago, she started to have them daily. They typically last a few minutes to a couple of hours and occur during eating and not during exertion. She had tried taking Alprazolam daily and that helped but was told to stop due to concern for side effects. She denies any CP or dyspnea with exertion despite working out 3x/week and walking 3-4 miles per day. She denies any pre-syncope  ROS: Please see the history of present illness. All other systems are reviewed and negative.   Past Medical History:  Diagnosis Date   Depression    Dyslipidemia    Elevated BP without diagnosis of hypertension    Elevated LDL cholesterol level    Family history of early CAD    IFG (impaired fasting glucose)    Osteoporosis     Past Surgical History:  Procedure Laterality Date   CATARACT EXTRACTION Left 2012   right 03/2019   CESAREAN SECTION  x1   TUBAL LIGATION  1994    Current Outpatient Medications  Medication Sig Dispense Refill   ALPRAZolam (XANAX) 0.5 MG tablet Take 0.25-0.5 mg by mouth 2 (two) times daily as needed.     valACYclovir (VALTREX) 500 MG tablet Take 500 mg by mouth as needed.     No current facility-administered medications for this visit.    Allergies:   Patient has no known allergies.   Social History:  Noncontributory  Family History:  Mother CVA (79s), Father (Bypass in 53s), and brother (CHF in 27s)  PHYSICAL EXAM: VS:  BP (!) 146/84   Pulse 72   Ht 5' 0.5 (1.537 m)   Wt 134 lb 12.8 oz (61.1 kg)   LMP 02/25/2016 (Approximate)   SpO2 96%   BMI  25.89 kg/m  , BMI Body mass index is 25.89 kg/m. GEN: Well nourished, well developed, in no acute distress HEENT: normal Neck: no JVD, carotid bruits, or masses Cardiac: RRR; no murmurs, rubs, or gallops,no edema  Respiratory:  CTAB bilaterally, normal work of breathing GI: soft, nontender, nondistended, + BS Extremities: No LE edema Skin: warm and dry, no rash Neuro:  Strength and sensation are intact  EKG: NSR  Recent Labs: Reviewed  Studies: Reviewed  ASSESSMENT AND PLAN: Susan Vega is a 63 y.o. female who presents for palpitations.   #Palpitations #Calcification seen on mammogram #Family Hx of premature CAD #HLD - Presenting with increased burden of palpitations that now occurs daily and mainly occur at rest and with meals, and last a few mins to hours. Burden seemed to improved with benzos, suggesting some anxiety component. She was recently started on PPI for possible GERD. Overall, I do not think these are cardiac related especially given 13 days ziopatch w/o arrhythmias. Will order TTE to complete workup given daily symptoms. - Check Lpa given family Hx of premature CAD - Repeat CAC in 6 months   Signed, Joelle VEAR Ren Donley, MD  04/14/2024 9:57 AM    [facility]

## 2024-04-14 ENCOUNTER — Ambulatory Visit

## 2024-04-14 VITALS — BP 146/84 | HR 72 | Ht 60.5 in | Wt 134.8 lb

## 2024-04-14 DIAGNOSIS — R002 Palpitations: Secondary | ICD-10-CM | POA: Diagnosis not present

## 2024-04-14 DIAGNOSIS — Z8249 Family history of ischemic heart disease and other diseases of the circulatory system: Secondary | ICD-10-CM | POA: Diagnosis not present

## 2024-04-14 DIAGNOSIS — E7800 Pure hypercholesterolemia, unspecified: Secondary | ICD-10-CM

## 2024-04-14 DIAGNOSIS — R921 Mammographic calcification found on diagnostic imaging of breast: Secondary | ICD-10-CM | POA: Diagnosis not present

## 2024-04-14 NOTE — Patient Instructions (Signed)
 Medication Instructions:  No medication changes were made at this visit. Continue current regimen.   *If you need a refill on your cardiac medications before your next appointment, please call your pharmacy*  Lab Work TODAY: Lp(a) If you have labs (blood work) drawn today and your tests are completely normal, you will receive your results only by: MyChart Message (if you have MyChart) OR A paper copy in the mail If you have any lab test that is abnormal or we need to change your treatment, we will call you to review the results.  Testing/Procedures: ECHOCARDIOGRAM Your physician has requested that you have an echocardiogram. Echocardiography is a painless test that uses sound waves to create images of your heart. It provides your doctor with information about the size and shape of your heart and how well your heart's chambers and valves are working. This procedure takes approximately one hour. There are no restrictions for this procedure. Please do NOT wear cologne, perfume, aftershave, or lotions (deodorant is allowed). Please arrive 15 minutes prior to your appointment time.  Please note: We ask at that you not bring children with you during ultrasound (echo/ vascular) testing. Due to room size and safety concerns, children are not allowed in the ultrasound rooms during exams. Our front office staff cannot provide observation of children in our lobby area while testing is being conducted. An adult accompanying a patient to their appointment will only be allowed in the ultrasound room at the discretion of the ultrasound technician under special circumstances. We apologize for any inconvenience.   CALCIUM SCORE IN 6 MONTHS Your physician has requested that you have a coronary calcium score performed. This is not covered by insurance and will be an out-of-pocket cost of approximately $99.   Follow-Up: At [facility], you and your health needs are our priority.  As part of our  continuing mission to provide you with exceptional heart care, our providers are all part of one team.  This team includes your primary Cardiologist (physician) and Advanced Practice Providers or APPs (Physician Assistants and Nurse Practitioners) who all work together to provide you with the care you need, when you need it.  Your next appointment:   AS NEEDED  Provider:   Joelle VEAR Ren Donley, MD    We recommend signing up for the patient portal called MyChart.  Sign up information is provided on this After Visit Summary.  MyChart is used to connect with patients for Virtual Visits (Telemedicine).  Patients are able to view lab/test results, encounter notes, upcoming appointments, etc.  Non-urgent messages can be sent to your provider as well.   To learn more about what you can do with MyChart, go to [email or web address].

## 2024-04-17 LAB — LIPOPROTEIN A (LPA): Lipoprotein (a): <8.4 nmol/L (ref <75.0)

## 2024-05-23 ENCOUNTER — Ambulatory Visit (HOSPITAL_COMMUNITY)
Admission: RE | Admit: 2024-05-23 | Discharge: 2024-05-23 | Disposition: A | Discharge status: Home / Self Care | Source: Ambulatory Visit | Attending: Cardiology | Admitting: Cardiology

## 2024-05-23 DIAGNOSIS — R002 Palpitations: Secondary | ICD-10-CM | POA: Insufficient documentation

## 2024-05-23 LAB — ECHOCARDIOGRAM COMPLETE
Area-P 1/2: 4.19 cm2
S' Lateral: 2.5 cm

## 2024-05-24 ENCOUNTER — Ambulatory Visit: Payer: Self-pay

## 2024-06-20 DIAGNOSIS — H40013 Open angle with borderline findings, low risk, bilateral: Secondary | ICD-10-CM | POA: Diagnosis not present

## 2024-06-20 DIAGNOSIS — H35013 Changes in retinal vascular appearance, bilateral: Secondary | ICD-10-CM | POA: Diagnosis not present

## 2024-06-20 DIAGNOSIS — H35361 Drusen (degenerative) of macula, right eye: Secondary | ICD-10-CM | POA: Diagnosis not present

## 2024-06-20 DIAGNOSIS — H35033 Hypertensive retinopathy, bilateral: Secondary | ICD-10-CM | POA: Diagnosis not present

## 2024-10-12 ENCOUNTER — Other Ambulatory Visit (HOSPITAL_COMMUNITY)
